# Patient Record
Sex: Male | Born: 1970 | Race: White | Hispanic: No | Marital: Single | Smoking: Never smoker
Health system: Southern US, Community
[De-identification: ages and names within clinical notes are randomized; demographics above are authoritative.]

## PROBLEM LIST (undated history)

## (undated) DIAGNOSIS — M1990 Unspecified osteoarthritis, unspecified site: Secondary | ICD-10-CM

## (undated) DIAGNOSIS — S46211A Strain of muscle, fascia and tendon of other parts of biceps, right arm, initial encounter: Secondary | ICD-10-CM

## (undated) DIAGNOSIS — Z5189 Encounter for other specified aftercare: Secondary | ICD-10-CM

## (undated) DIAGNOSIS — K219 Gastro-esophageal reflux disease without esophagitis: Secondary | ICD-10-CM

## (undated) HISTORY — PX: LACERATION REPAIR: SHX5168

## (undated) HISTORY — PX: WISDOM TOOTH EXTRACTION: SHX21

## (undated) HISTORY — DX: Unspecified osteoarthritis, unspecified site: M19.90

## (undated) HISTORY — DX: Encounter for other specified aftercare: Z51.89

## (undated) HISTORY — PX: KNEE SURGERY: SHX244

---

## 2001-02-05 ENCOUNTER — Emergency Department (HOSPITAL_COMMUNITY): Admission: EM | Admit: 2001-02-05 | Discharge: 2001-02-05 | Payer: Self-pay

## 2001-03-03 ENCOUNTER — Encounter: Admission: RE | Admit: 2001-03-03 | Discharge: 2001-03-03 | Payer: Self-pay | Admitting: Orthopedic Surgery

## 2001-03-03 ENCOUNTER — Encounter: Payer: Self-pay | Admitting: Orthopedic Surgery

## 2001-03-06 ENCOUNTER — Encounter: Admission: RE | Admit: 2001-03-06 | Discharge: 2001-03-06 | Payer: Self-pay | Admitting: Orthopedic Surgery

## 2001-03-06 ENCOUNTER — Encounter: Payer: Self-pay | Admitting: Orthopedic Surgery

## 2002-03-13 ENCOUNTER — Encounter: Payer: Self-pay | Admitting: Urology

## 2002-03-13 ENCOUNTER — Encounter: Admission: RE | Admit: 2002-03-13 | Discharge: 2002-03-13 | Payer: Self-pay | Admitting: Urology

## 2002-03-14 ENCOUNTER — Encounter: Payer: Self-pay | Admitting: Urology

## 2002-03-14 ENCOUNTER — Encounter: Admission: RE | Admit: 2002-03-14 | Discharge: 2002-03-14 | Payer: Self-pay | Admitting: Urology

## 2006-11-09 ENCOUNTER — Encounter: Admission: RE | Admit: 2006-11-09 | Discharge: 2006-11-09 | Payer: Self-pay | Admitting: Orthopedic Surgery

## 2016-11-04 ENCOUNTER — Emergency Department (HOSPITAL_COMMUNITY): Payer: 59

## 2016-11-04 ENCOUNTER — Emergency Department (HOSPITAL_COMMUNITY)
Admission: EM | Admit: 2016-11-04 | Discharge: 2016-11-04 | Disposition: A | Discharge status: Home / Self Care | Payer: 59 | Attending: Emergency Medicine | Admitting: Emergency Medicine

## 2016-11-04 ENCOUNTER — Encounter (HOSPITAL_COMMUNITY): Payer: Self-pay

## 2016-11-04 DIAGNOSIS — J189 Pneumonia, unspecified organism: Secondary | ICD-10-CM | POA: Diagnosis not present

## 2016-11-04 DIAGNOSIS — Z7982 Long term (current) use of aspirin: Secondary | ICD-10-CM | POA: Diagnosis not present

## 2016-11-04 DIAGNOSIS — R0789 Other chest pain: Secondary | ICD-10-CM | POA: Diagnosis present

## 2016-11-04 LAB — CBC
HCT: 43.1 % (ref 39.0–52.0)
HEMOGLOBIN: 15.1 g/dL (ref 13.0–17.0)
MCH: 30.7 pg (ref 26.0–34.0)
MCHC: 35 g/dL (ref 30.0–36.0)
MCV: 87.6 fL (ref 78.0–100.0)
Platelets: 239 10*3/uL (ref 150–400)
RBC: 4.92 MIL/uL (ref 4.22–5.81)
RDW: 12.4 % (ref 11.5–15.5)
WBC: 9 10*3/uL (ref 4.0–10.5)

## 2016-11-04 LAB — BASIC METABOLIC PANEL
ANION GAP: 8 (ref 5–15)
BUN: 12 mg/dL (ref 6–20)
CALCIUM: 9.3 mg/dL (ref 8.9–10.3)
CHLORIDE: 105 mmol/L (ref 101–111)
CO2: 25 mmol/L (ref 22–32)
Creatinine, Ser: 1.16 mg/dL (ref 0.61–1.24)
GFR calc non Af Amer: >60 mL/min (ref >60)
Glucose, Bld: 92 mg/dL (ref 65–99)
POTASSIUM: 3.6 mmol/L (ref 3.5–5.1)
Sodium: 138 mmol/L (ref 135–145)

## 2016-11-04 LAB — I-STAT TROPONIN, ED
TROPONIN I, POC: 0 ng/mL (ref 0.00–0.08)
Troponin i, poc: 0 ng/mL (ref 0.00–0.08)

## 2016-11-04 MED ORDER — PREDNISONE 20 MG PO TABS
60.0000 mg | ORAL_TABLET | Freq: Once | ORAL | Status: AC
  Administered 2016-11-04: 60 mg via ORAL
  Filled 2016-11-04: qty 3

## 2016-11-04 MED ORDER — CEFTRIAXONE SODIUM 1 G IJ SOLR
1.0000 g | Freq: Once | INTRAMUSCULAR | Status: AC
  Administered 2016-11-04: 1 g via INTRAMUSCULAR
  Filled 2016-11-04: qty 10

## 2016-11-04 MED ORDER — PREDNISONE 50 MG PO TABS: ORAL_TABLET | ORAL | 0 refills | Status: DC

## 2016-11-04 MED ORDER — LIDOCAINE HCL (PF) 1 % IJ SOLN
INTRAMUSCULAR | Status: AC
  Administered 2016-11-04: 22:00:00
  Filled 2016-11-04: qty 5

## 2016-11-04 MED ORDER — AZITHROMYCIN 250 MG PO TABS: 250.0000 mg | ORAL_TABLET | Freq: Every day | ORAL | 0 refills | Status: DC

## 2016-11-04 MED ORDER — ALBUTEROL SULFATE HFA 108 (90 BASE) MCG/ACT IN AERS
2.0000 | INHALATION_SPRAY | RESPIRATORY_TRACT | Status: DC | PRN
  Administered 2016-11-04: 2 via RESPIRATORY_TRACT
  Filled 2016-11-04: qty 6.7

## 2016-11-04 MED ORDER — IPRATROPIUM-ALBUTEROL 0.5-2.5 (3) MG/3ML IN SOLN
3.0000 mL | Freq: Once | RESPIRATORY_TRACT | Status: AC
  Administered 2016-11-04: 3 mL via RESPIRATORY_TRACT
  Filled 2016-11-04: qty 3

## 2016-11-04 NOTE — Discharge Instructions (Signed)
°  Take your antibiotics as directed and to completion. You should never have any leftover antibiotics! Push fluids and stay well hydrated.  ° °Please follow with your primary care doctor in the next 2 days for a check-up. They must obtain records for further management.  ° °Do not hesitate to return to the Emergency Department for any new, worsening or concerning symptoms.  ° °

## 2016-11-04 NOTE — ED Provider Notes (Signed)
[facility] DEPT Provider Note   CSN: [id number] Arrival date & time: 11/04/16  1818     History   Chief Complaint Chief Complaint  Patient presents with  . Chest Pain   HPI   Blood pressure 126/84, pulse 61, temperature 97.7 F (36.5 C), temperature source Oral, resp. rate 19, SpO2 98 %.  Joseph Allen is a 45 y.o. male complaining of Anterior diffuse chest tightness intermittently over the course of the day lasting approximately 1.5 minutes that associated with shortness of breath, syncope, lightheadedness, nausea, vomiting, diaphoresis. He had a similar episode several years ago when he was stressed. He states that he has a lot of stress at his job right now. He denies fever, chills, diabetes, hypertension, hyperlipidemia, family history of ACS, tobacco use, cocaine or methamphetamine use. He has had productive cough recently, states it was exacerbated when he did a training exercise outside.   History reviewed. No pertinent past medical history.  There are no active problems to display for this patient.   History reviewed. No pertinent surgical history.     Home Medications    Prior to Admission medications   Medication Sig Start Date End Date Taking? Authorizing Provider  aspirin 325 MG tablet Take 650 mg by mouth every 6 (six) hours as needed for moderate pain.    Yes Historical Provider, MD  omeprazole (PRILOSEC OTC) 20 MG tablet Take 20 mg by mouth daily.   Yes Historical Provider, MD  VIAGRA 100 MG tablet Take 100 mg by mouth daily as needed for erectile dysfunction.  09/28/16  Yes Historical Provider, MD  azithromycin (ZITHROMAX Z-PAK) 250 MG tablet Take 1 tablet (250 mg total) by mouth daily. 500mg  PO day 1, then 250mg  PO days 205 11/04/16   Shalisha Clausing, PA-C  predniSONE (DELTASONE) 50 MG tablet Take 1 tablet daily with breakfast 11/04/16   Monico Blitz, PA-C    Family History History reviewed. No pertinent family history.  Social  History Social History  Substance Use Topics  . Smoking status: Never Smoker  . Smokeless tobacco: Never Used  . Alcohol use Yes     Comment: occ     Allergies   Patient has no known allergies.   Review of Systems Review of Systems  10 systems reviewed and found to be negative, except as noted in the HPI.  Physical Exam Updated Vital Signs BP 112/70   Pulse 62   Temp 97.7 F (36.5 C) (Oral)   Resp 18   SpO2 99%   Physical Exam  Constitutional: He is oriented to person, place, and time. He appears well-developed and well-nourished. No distress.  HENT:  Head: Normocephalic and atraumatic.  Mouth/Throat: Oropharynx is clear and moist.  Eyes: Conjunctivae and EOM are normal. Pupils are equal, round, and reactive to light.  Neck: Normal range of motion. No JVD present. No tracheal deviation present.  Cardiovascular: Normal rate, regular rhythm and intact distal pulses.   Radial pulse equal bilaterally  Pulmonary/Chest: Effort normal and breath sounds normal. No stridor. No respiratory distress. He has no wheezes. He has no rales. He exhibits no tenderness.  Abdominal: Soft. He exhibits no distension and no mass. There is no tenderness. There is no rebound and no guarding.  Musculoskeletal: Normal range of motion. He exhibits no edema or tenderness.  No calf asymmetry, superficial collaterals, palpable cords, edema, Homans sign negative bilaterally.    Neurological: He is alert and oriented to person, place, and time.  Skin: Skin is warm.  He is not diaphoretic.  Psychiatric: He has a normal mood and affect.  Nursing note and vitals reviewed.    ED Treatments / Results  Labs (all labs ordered are listed, but only abnormal results are displayed) Labs Reviewed  BASIC METABOLIC PANEL  CBC  I-STAT [place], ED  I-STAT [place], ED    EKG  EKG Interpretation  Date/Time:  Thursday November 04 2016 18:26:40 EST Ventricular Rate:  69 PR Interval:  144 QRS  Duration: 84 QT Interval:  372 QTC Calculation: 398 R Axis:   58 Text Interpretation:  Normal sinus rhythm Normal ECG Confirmed by DELO  MD, DOUGLAS ([id number]) on 11/04/2016 8:31:18 PM       Radiology Dg Chest 2 View  Result Date: 11/04/2016 CLINICAL DATA:  Intermittent chest tightness today. EXAM: CHEST  2 VIEW COMPARISON:  None. FINDINGS: Right lung is clear. There is some minimal atelectasis or pneumonia at the left base. The cardiopericardial silhouette is within normal limits for size. The visualized bony structures of the thorax are intact. IMPRESSION: Subtle patchy airspace disease left lung base compatible with atelectasis or pneumonia. Electronically Signed   By: Misty Stanley M.D.   On: 11/04/2016 18:50    Procedures Procedures (including critical care time)  Medications Ordered in ED Medications  albuterol (PROVENTIL HFA;VENTOLIN HFA) 108 (90 Base) MCG/ACT inhaler 2 puff (2 puffs Inhalation Given 11/04/16 2236)  ipratropium-albuterol (DUONEB) 0.5-2.5 (3) MG/3ML nebulizer solution 3 mL (3 mLs Nebulization Given 11/04/16 2040)  predniSONE (DELTASONE) tablet 60 mg (60 mg Oral Given 11/04/16 2040)  cefTRIAXone (ROCEPHIN) injection 1 g (1 g Intramuscular Given 11/04/16 2206)  lidocaine (PF) (XYLOCAINE) 1 % injection (  Given 11/04/16 2206)     Initial Impression / Assessment and Plan / ED Course  I have reviewed the triage vital signs and the nursing notes.  Pertinent labs & imaging results that were available during my care of the patient were reviewed by me and considered in my medical decision making (see chart for details).  Clinical Course     Vitals:   11/04/16 2045 11/04/16 2115 11/04/16 2145 11/04/16 2230  BP: 124/84 123/78 115/74 112/70  Pulse: (!) 52 (!) 58 (!) 59 62  Resp: 16 15 16 18   Temp:      TempSrc:      SpO2: 100% 98% 98% 99%    Medications  albuterol (PROVENTIL HFA;VENTOLIN HFA) 108 (90 Base) MCG/ACT inhaler 2 puff (2 puffs Inhalation Given  11/04/16 2236)  ipratropium-albuterol (DUONEB) 0.5-2.5 (3) MG/3ML nebulizer solution 3 mL (3 mLs Nebulization Given 11/04/16 2040)  predniSONE (DELTASONE) tablet 60 mg (60 mg Oral Given 11/04/16 2040)  cefTRIAXone (ROCEPHIN) injection 1 g (1 g Intramuscular Given 11/04/16 2206)  lidocaine (PF) (XYLOCAINE) 1 % injection (  Given 11/04/16 2206)    Joseph Allen is 45 y.o. male presenting with Diffuse anterior chest tightness intermittently over the course of the last day, episodes last approximately 1-2 minutes and resolve spontaneously. Not exertional. No other associated symptoms aside from productive cough. Lung sounds clear. Chest x-ray with atelectasis versus pneumonia, will start on community-acquired regimen although I am not convinced that this is a true pneumonia. EKG unchanged. Blood work reassuring. He is low risk by heart score. Will obtain delta troponin, think there may be an element of anxiety and possibly bronchitis with reactive airway causing this tightness, patient will be given prednisone and nebulizer.   She feels better after nebulizer, delta troponin negative.  Evaluation does not show pathology  that would require ongoing emergent intervention or inpatient treatment. Pt is hemodynamically stable and mentating appropriately. Discussed findings and plan with patient/guardian, who agrees with care plan. All questions answered. Return precautions discussed and outpatient follow up given.    Final Clinical Impressions(s) / ED Diagnoses   Final diagnoses:  Community acquired pneumonia, unspecified laterality    New Prescriptions Discharge Medication List as of 11/04/2016 10:22 PM    START taking these medications   Details  azithromycin (ZITHROMAX Z-PAK) 250 MG tablet Take 1 tablet (250 mg total) by mouth daily. 500mg  PO day 1, then 250mg  PO days 205, Starting Thu 11/04/2016, Print    predniSONE (DELTASONE) 50 MG tablet Take 1 tablet daily with breakfast, Print          Monico Blitz, PA-C 11/04/16 [place], MD 11/04/16 [phone]

## 2016-11-04 NOTE — ED Triage Notes (Signed)
Pt reports feeling chest tightness several times intermittently today. No cardiac hx. Pt denies nausea or diaphoresis. Pt describes the pain as an overworked muscle.

## 2016-11-04 NOTE — ED Notes (Signed)
Patient Alert and oriented X4. Stable and ambulatory. Patient verbalized understanding of the discharge instructions.  Patient belongings were taken by the patient.  

## 2016-11-16 DIAGNOSIS — J189 Pneumonia, unspecified organism: Secondary | ICD-10-CM | POA: Diagnosis not present

## 2017-04-19 DIAGNOSIS — Z0000 Encounter for general adult medical examination without abnormal findings: Secondary | ICD-10-CM | POA: Diagnosis not present

## 2017-04-19 DIAGNOSIS — Z125 Encounter for screening for malignant neoplasm of prostate: Secondary | ICD-10-CM | POA: Diagnosis not present

## 2017-04-19 DIAGNOSIS — R7301 Impaired fasting glucose: Secondary | ICD-10-CM | POA: Diagnosis not present

## 2017-04-26 DIAGNOSIS — M5136 Other intervertebral disc degeneration, lumbar region: Secondary | ICD-10-CM | POA: Diagnosis not present

## 2017-04-26 DIAGNOSIS — Z23 Encounter for immunization: Secondary | ICD-10-CM | POA: Diagnosis not present

## 2017-04-26 DIAGNOSIS — Z0000 Encounter for general adult medical examination without abnormal findings: Secondary | ICD-10-CM | POA: Diagnosis not present

## 2017-04-26 DIAGNOSIS — Z1389 Encounter for screening for other disorder: Secondary | ICD-10-CM | POA: Diagnosis not present

## 2017-04-26 DIAGNOSIS — R7301 Impaired fasting glucose: Secondary | ICD-10-CM | POA: Diagnosis not present

## 2017-07-17 ENCOUNTER — Emergency Department (HOSPITAL_BASED_OUTPATIENT_CLINIC_OR_DEPARTMENT_OTHER)
Admission: EM | Admit: 2017-07-17 | Discharge: 2017-07-17 | Disposition: A | Discharge status: Home / Self Care | Payer: Worker's Compensation | Attending: Emergency Medicine | Admitting: Emergency Medicine

## 2017-07-17 ENCOUNTER — Emergency Department (HOSPITAL_BASED_OUTPATIENT_CLINIC_OR_DEPARTMENT_OTHER): Payer: Worker's Compensation

## 2017-07-17 ENCOUNTER — Encounter (HOSPITAL_BASED_OUTPATIENT_CLINIC_OR_DEPARTMENT_OTHER): Payer: Self-pay | Admitting: Emergency Medicine

## 2017-07-17 DIAGNOSIS — Z79899 Other long term (current) drug therapy: Secondary | ICD-10-CM | POA: Diagnosis not present

## 2017-07-17 DIAGNOSIS — M79601 Pain in right arm: Secondary | ICD-10-CM | POA: Diagnosis not present

## 2017-07-17 NOTE — ED Triage Notes (Signed)
Pt had a heavy box fall at work on Wednesday and when he tried to catch it he hyperextended the R arm. Pt c/o R arm pain radiating into his axilla area. Pt taking OTC meds, using ice and heat without relief.

## 2017-07-17 NOTE — Discharge Instructions (Signed)
Your x-rays are normal. You likely have a muscle or tendon strain Take ibuprofen, tylenol and ice Follow-up with sports medicine We recommend light duty for next 2 weeks

## 2017-07-17 NOTE — ED Provider Notes (Signed)
[facility] Provider Note   CSN: [id number] Arrival date & time: 07/17/17  1151     History   Chief Complaint Chief Complaint  Patient presents with  . Arm pain    HPI Joseph MCMANAMAN is a 46 y.o. male.  The history is provided by the patient.   46 year old male who presents with right arm pain. He is right arm dominant and otherwise healthy. Works in the [employer] office and was doing heavy lifting on Wednesday. States that the box that he was lifting was fighting out and it hyperextended his arm. He heard a pop and subsequently had pain in the elbow and forearm. Has been using ice, ibuprofen for supportive care without improvement in his symptoms. Has not had any arm swelling. States that he has not been able to lift with his arm, but is able to have normal range of motion. No numbness or weakness. Pain worse when he tries to lift. No alleviating factor.  History reviewed. No pertinent past medical history.  There are no active problems to display for this patient.   History reviewed. No pertinent surgical history.     Home Medications    Prior to Admission medications   Medication Sig Start Date End Date Taking? Authorizing Provider  aspirin 325 MG tablet Take 650 mg by mouth every 6 (six) hours as needed for moderate pain.     [provider]  azithromycin (ZITHROMAX Z-PAK) 250 MG tablet Take 1 tablet (250 mg total) by mouth daily. 500mg  PO day 1, then 250mg  PO days 205 11/04/16   Pisciotta, Elmyra Ricks, PA-C  omeprazole (PRILOSEC OTC) 20 MG tablet Take 20 mg by mouth daily.    [provider]  predniSONE (DELTASONE) 50 MG tablet Take 1 tablet daily with breakfast 11/04/16   Pisciotta, Elmyra Ricks, PA-C  VIAGRA 100 MG tablet Take 100 mg by mouth daily as needed for erectile dysfunction.  09/28/16   [provider]    Family History No family history on file.  Social History Social History  Substance Use Topics  . Smoking status: Never  Smoker  . Smokeless tobacco: Never Used  . Alcohol use Yes     Comment: occ     Allergies   Patient has no known allergies.   Review of Systems Review of Systems  Constitutional: Negative for fever.  Cardiovascular: Negative for chest pain.  Musculoskeletal: Positive for arthralgias (right arm pain).  Neurological: Negative for weakness and numbness.  Hematological: Does not bruise/bleed easily.     Physical Exam Updated Vital Signs BP 130/81 (BP Location: Right Arm)   Pulse 66   Temp 98.7 F (37.1 C) (Oral)   Resp 16   Ht 5\' 9"  (1.753 m)   Wt 87.1 kg (192 lb)   SpO2 97%   BMI 28.35 kg/m   Physical Exam Physical Exam  Constitutional: Appears well-developed and well-nourished. No acute distress. HENT:  Head: Normocephalic.  Eyes: Conjunctivae are normal.  Cardiovascular: Normal rate and intact distal pulses.  +2 radial pulses Pulmonary/Chest: Effort normal. No respiratory distress.  Abdominal: Exhibits no distension.  Musculoskeletal: Normal range of motion. Exhibits no deformity. Tenderness to deep palpation of the right mid forearm. Normal range of motion of the right arm.  Neurological: Alert. Fluent speech. Intact innervation of the ulnar, radial, median, and axillary nerves of the right arm and hand. Skin: Skin is warm and dry.  Psychiatric: Normal mood and affect. Behavior is normal.  Nursing note and vitals reviewed.  ED Treatments / Results  Labs (all labs ordered are listed, but only abnormal results are displayed) Labs Reviewed - No data to display  EKG  EKG Interpretation None       Radiology Dg Elbow Complete Right  Result Date: 07/17/2017 CLINICAL DATA:  Left elbow pain after an injury trying to catch a falling box. EXAM: RIGHT ELBOW - COMPLETE 3+ VIEW COMPARISON:  None. FINDINGS: There is no evidence of fracture, dislocation, or joint effusion. There is no evidence of arthropathy or other focal bone abnormality. Soft tissues are  unremarkable. IMPRESSION: Normal examination. Electronically Signed   By: Claudie Revering M.D.   On: 07/17/2017 13:28   Dg Forearm Right  Result Date: 07/17/2017 CLINICAL DATA:  Right arm pain following an injury trying to catch a falling box. EXAM: RIGHT FOREARM - 2 VIEW COMPARISON:  Left elbow radiographs obtained today. FINDINGS: There is no evidence of fracture or other focal bone lesions. Soft tissues are unremarkable. IMPRESSION: Normal examination. Electronically Signed   By: Claudie Revering M.D.   On: 07/17/2017 13:28    Procedures Procedures (including critical care time)  Medications Ordered in ED Medications - No data to display   Initial Impression / Assessment and Plan / ED Course  I have reviewed the triage vital signs and the nursing notes.  Pertinent labs & imaging results that were available during my care of the patient were reviewed by me and considered in my medical decision making (see chart for details).     46 year old male who presents with right arm pain after heavy lifting. Range of motion intact. Neurovascularly intact. X-rays are normal. Suspect muscle or tendon strain. Supportive care reviewed. Follow up with sports medicine. Strict return and follow-up instructions reviewed. He expressed understanding of all discharge instructions and felt comfortable with the plan of care.   Final Clinical Impressions(s) / ED Diagnoses   Final diagnoses:  Right arm pain    New Prescriptions New Prescriptions   No medications on file     Forde Dandy, MD 07/17/17 1349

## 2017-08-01 ENCOUNTER — Other Ambulatory Visit: Payer: Self-pay | Admitting: Orthopedic Surgery

## 2017-08-01 ENCOUNTER — Encounter (HOSPITAL_COMMUNITY): Payer: Self-pay | Admitting: *Deleted
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
## 2017-08-02 ENCOUNTER — Ambulatory Visit (HOSPITAL_COMMUNITY)
Admission: RE | Admit: 2017-08-02 | Discharge: 2017-08-02 | Disposition: A | Discharge status: Home / Self Care | Payer: Worker's Compensation | Source: Ambulatory Visit | Attending: Orthopedic Surgery | Admitting: Orthopedic Surgery

## 2017-08-02 ENCOUNTER — Ambulatory Visit (HOSPITAL_BASED_OUTPATIENT_CLINIC_OR_DEPARTMENT_OTHER): Payer: Worker's Compensation | Admitting: Certified Registered"

## 2017-08-02 ENCOUNTER — Encounter (HOSPITAL_COMMUNITY): Payer: Self-pay | Admitting: *Deleted

## 2017-08-02 ENCOUNTER — Encounter (HOSPITAL_COMMUNITY)
Admission: RE | Disposition: A | Discharge status: Home / Self Care | Payer: Self-pay | Source: Ambulatory Visit | Attending: Orthopedic Surgery

## 2017-08-02 DIAGNOSIS — X500XXA Overexertion from strenuous movement or load, initial encounter: Secondary | ICD-10-CM | POA: Insufficient documentation

## 2017-08-02 DIAGNOSIS — Y990 Civilian activity done for income or pay: Secondary | ICD-10-CM | POA: Diagnosis not present

## 2017-08-02 DIAGNOSIS — S46211A Strain of muscle, fascia and tendon of other parts of biceps, right arm, initial encounter: Secondary | ICD-10-CM | POA: Insufficient documentation

## 2017-08-02 HISTORY — PX: DISTAL BICEPS TENDON REPAIR: SHX1461

## 2017-08-02 HISTORY — DX: Strain of muscle, fascia and tendon of other parts of biceps, right arm, initial encounter: S46.211A

## 2017-08-02 HISTORY — DX: Gastro-esophageal reflux disease without esophagitis: K21.9

## 2017-08-02 SURGERY — REPAIR, TENDON, BICEPS, DISTAL
Anesthesia: General | Site: Elbow | Laterality: Right

## 2017-08-02 MED ORDER — MIDAZOLAM HCL 2 MG/2ML IJ SOLN
1.0000 mg | INTRAMUSCULAR | Status: DC | PRN
  Administered 2017-08-02: 2 mg via INTRAVENOUS

## 2017-08-02 MED ORDER — SCOPOLAMINE 1 MG/3DAYS TD PT72: 1.0000 | MEDICATED_PATCH | Freq: Once | TRANSDERMAL | Status: DC | PRN

## 2017-08-02 MED ORDER — BUPIVACAINE-EPINEPHRINE (PF) 0.5% -1:200000 IJ SOLN
INTRAMUSCULAR | Status: DC | PRN
  Administered 2017-08-02: 30 mL via PERINEURAL

## 2017-08-02 MED ORDER — OXYCODONE HCL 5 MG/5ML PO SOLN: 5.0000 mg | Freq: Once | ORAL | Status: DC | PRN

## 2017-08-02 MED ORDER — PROMETHAZINE HCL 25 MG/ML IJ SOLN: 6.2500 mg | INTRAMUSCULAR | Status: DC | PRN

## 2017-08-02 MED ORDER — FENTANYL CITRATE (PF) 100 MCG/2ML IJ SOLN
50.0000 ug | INTRAMUSCULAR | Status: DC | PRN
  Administered 2017-08-02: 100 ug via INTRAVENOUS

## 2017-08-02 MED ORDER — OXYCODONE HCL 5 MG PO TABS: 5.0000 mg | ORAL_TABLET | Freq: Once | ORAL | Status: DC | PRN

## 2017-08-02 MED ORDER — CEFAZOLIN SODIUM-DEXTROSE 2-4 GM/100ML-% IV SOLN
2.0000 g | INTRAVENOUS | Status: AC
  Administered 2017-08-02: 2 g via INTRAVENOUS

## 2017-08-02 MED ORDER — DEXAMETHASONE SODIUM PHOSPHATE 10 MG/ML IJ SOLN
INTRAMUSCULAR | Status: DC | PRN
  Administered 2017-08-02: 10 mg via INTRAVENOUS

## 2017-08-02 MED ORDER — MIDAZOLAM HCL 2 MG/2ML IJ SOLN
INTRAMUSCULAR | Status: AC
  Filled 2017-08-02: qty 2

## 2017-08-02 MED ORDER — PROPOFOL 10 MG/ML IV BOLUS
INTRAVENOUS | Status: DC | PRN
  Administered 2017-08-02: 200 mg via INTRAVENOUS

## 2017-08-02 MED ORDER — LACTATED RINGERS IV SOLN
INTRAVENOUS | Status: DC
  Administered 2017-08-02 (×2): via INTRAVENOUS

## 2017-08-02 MED ORDER — LACTATED RINGERS IV SOLN: INTRAVENOUS | Status: DC

## 2017-08-02 MED ORDER — MEPERIDINE HCL 25 MG/ML IJ SOLN: 6.2500 mg | INTRAMUSCULAR | Status: DC | PRN

## 2017-08-02 MED ORDER — ONDANSETRON HCL 4 MG/2ML IJ SOLN
INTRAMUSCULAR | Status: DC | PRN
  Administered 2017-08-02: 4 mg via INTRAVENOUS

## 2017-08-02 MED ORDER — HYDROMORPHONE HCL 1 MG/ML IJ SOLN: 0.2500 mg | INTRAMUSCULAR | Status: DC | PRN

## 2017-08-02 MED ORDER — FENTANYL CITRATE (PF) 100 MCG/2ML IJ SOLN
INTRAMUSCULAR | Status: AC
  Filled 2017-08-02: qty 2

## 2017-08-02 MED ORDER — CEFAZOLIN SODIUM-DEXTROSE 2-4 GM/100ML-% IV SOLN
INTRAVENOUS | Status: AC
  Filled 2017-08-02: qty 100

## 2017-08-02 MED ORDER — LIDOCAINE HCL (CARDIAC) 20 MG/ML IV SOLN
INTRAVENOUS | Status: DC | PRN
  Administered 2017-08-02: 30 mg via INTRAVENOUS

## 2017-08-02 SURGICAL SUPPLY — 67 items
APPLIER CLIP 9.375 MED OPEN (MISCELLANEOUS)
BENZOIN TINCTURE PRP APPL 2/3 (GAUZE/BANDAGES/DRESSINGS) ×3 IMPLANT
BLADE SURG 15 STRL LF DISP TIS (BLADE) ×2 IMPLANT
BLADE SURG 15 STRL SS (BLADE) ×4
BNDG COHESIVE 4X5 TAN STRL (GAUZE/BANDAGES/DRESSINGS) ×3 IMPLANT
BNDG ESMARK 4X9 LF (GAUZE/BANDAGES/DRESSINGS) ×3 IMPLANT
BNDG GAUZE ELAST 4 BULKY (GAUZE/BANDAGES/DRESSINGS) ×3 IMPLANT
BRUSH SCRUB EZ PLAIN DRY (MISCELLANEOUS) IMPLANT
CHLORAPREP W/TINT 26ML (MISCELLANEOUS) ×3 IMPLANT
CLIP APPLIE 9.375 MED OPEN (MISCELLANEOUS) IMPLANT
CLIP VESOCCLUDE MED 6/CT (CLIP) ×3 IMPLANT
CLOSURE WOUND 1/2 X4 (GAUZE/BANDAGES/DRESSINGS) ×1
CORD BIPOLAR FORCEPS 12FT (ELECTRODE) ×3 IMPLANT
COVER BACK TABLE 60X90IN (DRAPES) ×3 IMPLANT
COVER MAYO STAND STRL (DRAPES) ×3 IMPLANT
CUFF TOURNIQUET SINGLE 18IN (TOURNIQUET CUFF) ×3 IMPLANT
CUFF TOURNIQUET SINGLE 24IN (TOURNIQUET CUFF) IMPLANT
DRAPE C-ARM 42X72 X-RAY (DRAPES) IMPLANT
DRAPE EXTREMITY T 121X128X90 (DRAPE) ×3 IMPLANT
DRAPE OEC MINIVIEW 54X84 (DRAPES) ×3 IMPLANT
DRAPE SURG 17X23 STRL (DRAPES) ×3 IMPLANT
DRSG ADAPTIC 3X8 NADH LF (GAUZE/BANDAGES/DRESSINGS) IMPLANT
DRSG EMULSION OIL 3X3 NADH (GAUZE/BANDAGES/DRESSINGS) ×3 IMPLANT
ELECT REM PT RETURN 9FT ADLT (ELECTROSURGICAL) ×3
ELECTRODE REM PT RTRN 9FT ADLT (ELECTROSURGICAL) ×1 IMPLANT
GAUZE SPONGE 4X4 12PLY STRL LF (GAUZE/BANDAGES/DRESSINGS) ×6 IMPLANT
GLOVE BIO SURGEON STRL SZ7.5 (GLOVE) ×3 IMPLANT
GLOVE BIOGEL PI IND STRL 7.0 (GLOVE) ×2 IMPLANT
GLOVE BIOGEL PI IND STRL 8 (GLOVE) ×1 IMPLANT
GLOVE BIOGEL PI INDICATOR 7.0 (GLOVE) ×4
GLOVE BIOGEL PI INDICATOR 8 (GLOVE) ×2
GLOVE ECLIPSE 6.5 STRL STRAW (GLOVE) ×6 IMPLANT
GOWN STRL REUS W/TWL XL LVL3 (GOWN DISPOSABLE) ×3 IMPLANT
KIT TRANSTIBIAL (DISPOSABLE) ×3 IMPLANT
NDL SUT 6 .5 CRC .975X.05 MAYO (NEEDLE) ×1 IMPLANT
NEEDLE HYPO 25X1 1.5 SAFETY (NEEDLE) IMPLANT
NEEDLE MAYO TAPER (NEEDLE) ×2
NS IRRIG 1000ML POUR BTL (IV SOLUTION) ×3 IMPLANT
PACK BASIN DAY SURGERY FS (CUSTOM PROCEDURE TRAY) ×3 IMPLANT
PADDING CAST ABS 4INX4YD NS (CAST SUPPLIES) ×2
PADDING CAST ABS COTTON 4X4 ST (CAST SUPPLIES) ×1 IMPLANT
PENCIL BUTTON HOLSTER BLD 10FT (ELECTRODE) ×3 IMPLANT
SLEEVE SCD COMPRESS KNEE MED (MISCELLANEOUS) ×3 IMPLANT
SPLINT FIBERGLASS 4X30 (CAST SUPPLIES) ×3 IMPLANT
SPONGE LAP 4X18 X RAY DECT (DISPOSABLE) ×3 IMPLANT
STOCKINETTE 6  STRL (DRAPES) ×2
STOCKINETTE 6 STRL (DRAPES) ×1 IMPLANT
STRIP CLOSURE SKIN 1/2X4 (GAUZE/BANDAGES/DRESSINGS) ×2 IMPLANT
SUCTION FRAZIER HANDLE 10FR (MISCELLANEOUS) ×2
SUCTION TUBE FRAZIER 10FR DISP (MISCELLANEOUS) ×1 IMPLANT
SUT FIBERWIRE #2 38 T-5 BLUE (SUTURE)
SUT FIBERWIRE 2-0 18 17.9 3/8 (SUTURE)
SUT VIC AB 0 SH 27 (SUTURE) IMPLANT
SUT VIC AB 2-0 CT3 27 (SUTURE) IMPLANT
SUT VIC AB 3-0 SH 27 (SUTURE) ×2
SUT VIC AB 3-0 SH 27X BRD (SUTURE) ×1 IMPLANT
SUT VICRYL 4-0 PS2 18IN ABS (SUTURE) IMPLANT
SUT VICRYL RAPIDE 4/0 PS 2 (SUTURE) ×3 IMPLANT
SUTURE FIBERWR #2 38 T-5 BLUE (SUTURE) IMPLANT
SUTURE FIBERWR 2-0 18 17.9 3/8 (SUTURE) IMPLANT
SYR 10ML LL (SYRINGE) IMPLANT
SYR BULB 3OZ (MISCELLANEOUS) ×3 IMPLANT
SYSTEM ARTHRO FOR DISTAL BICEP (Orthopedic Implant) ×3 IMPLANT
TOWEL OR 17X24 6PK STRL BLUE (TOWEL DISPOSABLE) ×3 IMPLANT
TUBE CONNECTING 20'X1/4 (TUBING) ×1
TUBE CONNECTING 20X1/4 (TUBING) ×2 IMPLANT
UNDERPAD 30X30 (UNDERPADS AND DIAPERS) ×3 IMPLANT

## 2017-08-02 NOTE — Progress Notes (Signed)
Assisted Dr. Germeroth with right, ultrasound guided, supraclavicular block. Side rails up, monitors on throughout procedure. See vital signs in flow sheet. Tolerated Procedure well. 

## 2017-08-02 NOTE — Anesthesia Procedure Notes (Addendum)
Anesthesia Regional Block: Interscalene brachial plexus block   Pre-Anesthetic Checklist: ,, timeout performed, Correct Patient, Correct Site, Correct Laterality, Correct Procedure, Correct Position, site marked, Risks and benefits discussed,  Surgical consent,  Pre-op evaluation,  At surgeon's request and post-op pain management  Laterality: Right  Prep: chloraprep       Needles:  Injection technique: Single-shot  Needle Type: Stimiplex          Additional Needles:   Procedures:,,,, ultrasound used (permanent image in chart),,,,  Narrative:  Start time: 08/02/2017 12:48 PM End time: 08/02/2017 12:52 PM  Performed by: Personally  Anesthesiologist: Nolon Nations  Additional Notes: Patient tolerated the procedure well without complications

## 2017-08-02 NOTE — Anesthesia Preprocedure Evaluation (Addendum)
Anesthesia Evaluation  Patient identified by MRN, date of birth, ID band Patient awake    Reviewed: Allergy & Precautions, NPO status , Patient's Chart, lab work & pertinent test results  Airway Mallampati: II  TM Distance: >3 FB Neck ROM: Full    Dental no notable dental hx.    Pulmonary neg pulmonary ROS,    Pulmonary exam normal breath sounds clear to auscultation       Cardiovascular negative cardio ROS Normal cardiovascular exam Rhythm:Regular Rate:Normal     Neuro/Psych negative neurological ROS  negative psych ROS   GI/Hepatic Neg liver ROS, GERD  ,  Endo/Other  negative endocrine ROS  Renal/GU negative Renal ROS     Musculoskeletal negative musculoskeletal ROS (+)   Abdominal   Peds  Hematology negative hematology ROS (+)   Anesthesia Other Findings   Reproductive/Obstetrics negative OB ROS                            Anesthesia Physical Anesthesia Plan  ASA: II  Anesthesia Plan: Regional and General   Post-op Pain Management:    Induction: Intravenous  PONV Risk Score and Plan: 2 and Ondansetron and Dexamethasone  Airway Management Planned: LMA  Additional Equipment:   Intra-op Plan:   Post-operative Plan: Extubation in OR  Informed Consent: I have reviewed the patients History and Physical, chart, labs and discussed the procedure including the risks, benefits and alternatives for the proposed anesthesia with the patient or authorized representative who has indicated his/her understanding and acceptance.   Dental advisory given  Plan Discussed with: CRNA  Anesthesia Plan Comments:       Anesthesia Quick Evaluation

## 2017-08-02 NOTE — Op Note (Signed)
08/02/2017  1:17 PM  PATIENT:  Joseph Allen  46 y.o. male  PRE-OPERATIVE DIAGNOSIS:  High-grade right distal biceps tendon injury  POST-OPERATIVE DIAGNOSIS:  Same  PROCEDURE:  Open repair of right distal biceps tendon  SURGEON: Rayvon Char. Grandville Silos, MD  PHYSICIAN ASSISTANT: Morley Kos, OPA-C  ANESTHESIA:  regional and general  SPECIMENS:  None  DRAINS:   None  EBL:  less than 50 mL  PREOPERATIVE INDICATIONS:  Joseph Allen is a  46 y.o. male with High-grade right distal biceps tendon injury.  The risks benefits and alternatives were discussed with the patient preoperatively including but not limited to the risks of infection, bleeding, nerve injury, cardiopulmonary complications, the need for revision surgery, among others, and the patient verbalized understanding and consented to proceed.  OPERATIVE IMPLANTS: Arthrex bio button and interference screw  OPERATIVE PROCEDURE:  After receiving prophylactic antibiotics and a regional block, the patient was escorted to the operative theatre and placed in a supine position.  General anesthesia was administered.A surgical "time-out" was performed during which the planned procedure, proposed operative site, and the correct patient identity were compared to the operative consent and agreement confirmed by the circulating nurse according to current facility policy.  Following application of a tourniquet to the operative extremity, the exposed skin was prepped with Chloraprep and draped in the usual sterile fashion.  The limb was exsanguinated with an Esmarch bandage and the tourniquet inflated to approximately 143mmHg higher than systolic BP.  A transverse incision exploited and the natural skin creases about 2 fingerbreadths distal to the antecubital crease was made sharply with a scalpel.  Subcutaneous tissues were dissected with blunt and spreading dissection.  The biceps tendon sheath was identified and opened through spreading  longitudinal dissection.  There was a lot of organizing fibrinous material about the tendon, which was entirely avulsed from the radius with the exception of a small 2 mm connection that remained intact.  This was divided.  The tendon ends were mucoid and mop ended.  This was resected back to healthy tendon.  The radial tuberosity was further cleaned of debris with a curet and Roger.  #5 FiberWire whipstitch was placed into the tendon and through the bio button.  The tunnel for the bio button was made, followed by an 8 mm near cortex overreaming.  The wound was copiously irrigated and suction used to help clear all of the bone dust and fragments.  The bio button was placed and flipped on the opposite side of the far cortex, and its proper placement confirmed with the mini C arm.  Images were saved.  The tendon was then potted into its bed and secured with #5 FiberWire tied, one limb having been brought to the tendon with a free needle.  The interference screw was then placed in standard fashion, on the radial side of the tendon, pushing the tendon towards the ulnar side of the hole.  The button was placed with a suture limb through it so that it could be further tied for added fixation.  Full extension could be achieved without any movement in the tendon, which was held quite solidly.  I then flexed the elbow and passive pronation was accommodated as well.  The wound was irrigated, tourniquet released, some additional hemostasis obtained and the skin was closed with 3-0 Vicryl deep dermal buried interrupted sutures and a couple of additional 4-0 Vicryl interrupted subcuticular sutures followed by benzoin and Steri-Strips.  A bulky dressing was applied followed by  sling and he was awakened and taken to the recovery room in stable condition, breathing spontaneously.  DISPOSITION: He will be discharged home today with typical instructions, returning in 10-15 days.  RADIOGRAPHS: 2 views of the right elbow obtained  fluoroscopically, printed, and saved reveal metallic fixation button adjacent to the radial cortex, with lucencies consistent with the two-step sized tunnels.  The button appears in proper position and alignment.

## 2017-08-02 NOTE — Transfer of Care (Signed)
Immediate Anesthesia Transfer of Care Note  Patient: Joseph Allen  Procedure(s) Performed: Procedure(s): REPAIR OF RIGHT DISTAL BICEPS TENDON (Right)  Patient Location: PACU  Anesthesia Type:GA combined with regional for post-op pain  Level of Consciousness: awake and patient cooperative  Airway & Oxygen Therapy: Patient Spontanous Breathing and Patient connected to face mask oxygen  Post-op Assessment: Report given to RN and Post -op Vital signs reviewed and stable  Post vital signs: Reviewed and stable  Last Vitals:  Vitals:   08/02/17 1258 08/02/17 1259  BP: 126/82   Pulse: 74 72  Resp: 16 18  Temp:    SpO2: 99% 98%    Last Pain:  Vitals:   08/02/17 1155  PainSc: 0-No pain      Patients Stated Pain Goal: 0 (59/53/96 [id number])  Complications: No apparent anesthesia complications

## 2017-08-02 NOTE — Interval H&P Note (Signed)
History and Physical Interval Note:  08/02/2017 1:16 PM  Joseph Allen  has presented today for surgery, with the diagnosis of RIGHT DISTAL BICEPS RUPTURE S46.211A  The various methods of treatment have been discussed with the patient and family. After consideration of risks, benefits and other options for treatment, the patient has consented to  Procedure(s): REPAIR OF RIGHT DISTAL BICEPS TENDON (Right) as a surgical intervention .  The patient's history has been reviewed, patient examined, no change in status, stable for surgery.  I have reviewed the patient's chart and labs.  Questions were answered to the patient's satisfaction.     Saranne Crislip A.

## 2017-08-02 NOTE — Anesthesia Procedure Notes (Signed)
Procedure Name: LMA Insertion Date/Time: 08/02/2017 1:27 PM Performed by: Modestine Scherzinger D Pre-anesthesia Checklist: Patient identified, Emergency Drugs available, Suction available and Patient being monitored Patient Re-evaluated:Patient Re-evaluated prior to induction Oxygen Delivery Method: Circle system utilized Preoxygenation: Pre-oxygenation with 100% oxygen Induction Type: IV induction Ventilation: Mask ventilation without difficulty LMA: LMA inserted LMA Size: 4.0 Number of attempts: 1 Airway Equipment and Method: Bite block Placement Confirmation: positive ETCO2 Tube secured with: Tape Dental Injury: Teeth and Oropharynx as per pre-operative assessment

## 2017-08-02 NOTE — Discharge Instructions (Signed)
Post Anesthesia Home Care Instructions  Activity: Get plenty of rest for the remainder of the day. A responsible individual must stay with you for 24 hours following the procedure.  For the next 24 hours, DO NOT: -Drive a car -Paediatric nurse -Drink alcoholic beverages -Take any medication unless instructed by your physician -Make any legal decisions or sign important papers.  Meals: Start with liquid foods such as gelatin or soup. Progress to regular foods as tolerated. Avoid greasy, spicy, heavy foods. If nausea and/or vomiting occur, drink only clear liquids until the nausea and/or vomiting subsides. Call your physician if vomiting continues.  Special Instructions/Symptoms: Your throat may feel dry or sore from the anesthesia or the breathing tube placed in your throat during surgery. If this causes discomfort, gargle with warm salt water. The discomfort should disappear within 24 hours.  If you had a scopolamine patch placed behind your ear for the management of post- operative nausea and/or vomiting:  1. The medication in the patch is effective for 72 hours, after which it should be removed.  Wrap patch in a tissue and discard in the trash. Wash hands thoroughly with soap and water. 2. You may remove the patch earlier than 72 hours if you experience unpleasant side effects which may include dry mouth, dizziness or visual disturbances. 3. Avoid touching the patch. Wash your hands with soap and water after contact with the patch.     Regional Anesthesia Blocks  1. Numbness or the inability to move the "blocked" extremity may last from 3-48 hours after placement. The length of time depends on the medication injected and your individual response to the medication. If the numbness is not going away after 48 hours, call your surgeon.  2. The extremity that is blocked will need to be protected until the numbness is gone and the  Strength has returned. Because you cannot feel it, you  will need to take extra care to avoid injury. Because it may be weak, you may have difficulty moving it or using it. You may not know what position it is in without looking at it while the block is in effect.  3. For blocks in the legs and feet, returning to weight bearing and walking needs to be done carefully. You will need to wait until the numbness is entirely gone and the strength has returned. You should be able to move your leg and foot normally before you try and bear weight or walk. You will need someone to be with you when you first try to ensure you do not fall and possibly risk injury.  4. Bruising and tenderness at the needle site are common side effects and will resolve in a few days.  5. Persistent numbness or new problems with movement should be communicated to the surgeon or the [facility] [phone] [place] ([phone]).   Discharge Instructions   You have a light dressing on your hand and arm.  You may begin gentle motion of your fingers and hand immediately, but you should not do any heavy lifting or gripping.  Elevate your hand to reduce pain & swelling of the digits.  Ice over the operative site may be helpful to reduce pain & swelling, ice in the arm pit may do the same.  DO NOT USE HEAT. Pain medicine has been prescribed for you.  Utilize the tylenol 650 mg and the ibuprofen 600 mg OTC taken together every 6 hours. The oxycodone can be taken as  a rescue medicine additionally. Leave the dressing in place until your return appointment.  Utilize the sling with the exception of working on ROM exercises. You may drive a car when you are off of prescription pain medications and can safely control your vehicle with both hands. We will address whether therapy will be required or not when you return to the office. You may have already made your follow-up appointment when we completed your preop visit.  If not, please call our office today or the  next business day to make your return appointment for 10-15 days after surgery.   Please call [phone] during normal business hours or [phone] after hours for any problems. Including the following:  - excessive redness of the incisions - drainage for more than 4 days - fever of more than 101.5 F  *Please note that pain medications will not be refilled after hours or on weekends.

## 2017-08-03 ENCOUNTER — Encounter (HOSPITAL_BASED_OUTPATIENT_CLINIC_OR_DEPARTMENT_OTHER): Payer: Self-pay | Admitting: Orthopedic Surgery

## 2017-08-03 NOTE — Anesthesia Postprocedure Evaluation (Signed)
Anesthesia Post Note  Patient: Joseph Allen  Procedure(s) Performed: Procedure(s) (LRB): REPAIR OF RIGHT DISTAL BICEPS TENDON (Right)     Patient location during evaluation: PACU Anesthesia Type: General and Regional Level of consciousness: sedated and patient cooperative Pain management: pain level controlled Vital Signs Assessment: post-procedure vital signs reviewed and stable Respiratory status: spontaneous breathing Cardiovascular status: stable Anesthetic complications: no    Last Vitals:  Vitals:   08/02/17 1530 08/02/17 1545  BP: 118/74 128/75  Pulse: 70 69  Resp: 12 16  Temp:  36.6 C  SpO2: 96%     Last Pain:  Vitals:   08/02/17 1545  PainSc: 0-No pain                 Nolon Nations

## 2017-08-12 NOTE — Addendum Note (Signed)
Addendum  created 08/12/17 0854 by Nolon Nations, MD   Anesthesia Intra Blocks edited, Sign clinical note

## 2018-01-27 DIAGNOSIS — Z1389 Encounter for screening for other disorder: Secondary | ICD-10-CM | POA: Diagnosis not present

## 2018-01-27 DIAGNOSIS — A4901 Methicillin susceptible Staphylococcus aureus infection, unspecified site: Secondary | ICD-10-CM | POA: Diagnosis not present

## 2018-04-25 DIAGNOSIS — Z125 Encounter for screening for malignant neoplasm of prostate: Secondary | ICD-10-CM | POA: Diagnosis not present

## 2018-04-25 DIAGNOSIS — Z0000 Encounter for general adult medical examination without abnormal findings: Secondary | ICD-10-CM | POA: Diagnosis not present

## 2018-04-25 DIAGNOSIS — R82998 Other abnormal findings in urine: Secondary | ICD-10-CM | POA: Diagnosis not present

## 2018-05-02 DIAGNOSIS — R7301 Impaired fasting glucose: Secondary | ICD-10-CM | POA: Diagnosis not present

## 2018-05-02 DIAGNOSIS — Z0000 Encounter for general adult medical examination without abnormal findings: Secondary | ICD-10-CM | POA: Diagnosis not present

## 2018-05-02 DIAGNOSIS — Z125 Encounter for screening for malignant neoplasm of prostate: Secondary | ICD-10-CM | POA: Diagnosis not present

## 2018-05-02 DIAGNOSIS — D1803 Hemangioma of intra-abdominal structures: Secondary | ICD-10-CM | POA: Diagnosis not present

## 2018-05-02 DIAGNOSIS — Z1389 Encounter for screening for other disorder: Secondary | ICD-10-CM | POA: Diagnosis not present

## 2018-05-02 DIAGNOSIS — K219 Gastro-esophageal reflux disease without esophagitis: Secondary | ICD-10-CM | POA: Diagnosis not present

## 2018-05-04 DIAGNOSIS — Z1212 Encounter for screening for malignant neoplasm of rectum: Secondary | ICD-10-CM | POA: Diagnosis not present

## 2018-05-23 ENCOUNTER — Encounter (HOSPITAL_BASED_OUTPATIENT_CLINIC_OR_DEPARTMENT_OTHER): Payer: Self-pay

## 2018-05-23 ENCOUNTER — Emergency Department (HOSPITAL_BASED_OUTPATIENT_CLINIC_OR_DEPARTMENT_OTHER)
Admission: EM | Admit: 2018-05-23 | Discharge: 2018-05-23 | Disposition: A | Discharge status: Home / Self Care | Payer: No Typology Code available for payment source | Attending: Emergency Medicine | Admitting: Emergency Medicine

## 2018-05-23 ENCOUNTER — Emergency Department (HOSPITAL_BASED_OUTPATIENT_CLINIC_OR_DEPARTMENT_OTHER): Payer: No Typology Code available for payment source

## 2018-05-23 DIAGNOSIS — Z79899 Other long term (current) drug therapy: Secondary | ICD-10-CM | POA: Diagnosis not present

## 2018-05-23 DIAGNOSIS — T148XXA Other injury of unspecified body region, initial encounter: Secondary | ICD-10-CM

## 2018-05-23 DIAGNOSIS — Y9389 Activity, other specified: Secondary | ICD-10-CM | POA: Diagnosis not present

## 2018-05-23 DIAGNOSIS — S71142A Puncture wound with foreign body, left thigh, initial encounter: Secondary | ICD-10-CM | POA: Insufficient documentation

## 2018-05-23 DIAGNOSIS — M795 Residual foreign body in soft tissue: Secondary | ICD-10-CM

## 2018-05-23 DIAGNOSIS — W3400XA Accidental discharge from unspecified firearms or gun, initial encounter: Secondary | ICD-10-CM | POA: Insufficient documentation

## 2018-05-23 DIAGNOSIS — S79922A Unspecified injury of left thigh, initial encounter: Secondary | ICD-10-CM | POA: Diagnosis present

## 2018-05-23 DIAGNOSIS — Y9259 Other trade areas as the place of occurrence of the external cause: Secondary | ICD-10-CM | POA: Diagnosis not present

## 2018-05-23 DIAGNOSIS — Z23 Encounter for immunization: Secondary | ICD-10-CM | POA: Diagnosis not present

## 2018-05-23 DIAGNOSIS — Y990 Civilian activity done for income or pay: Secondary | ICD-10-CM | POA: Insufficient documentation

## 2018-05-23 MED ORDER — CEPHALEXIN 500 MG PO CAPS: 500.0000 mg | ORAL_CAPSULE | Freq: Four times a day (QID) | ORAL | 0 refills | Status: AC

## 2018-05-23 MED ORDER — TETANUS-DIPHTH-ACELL PERTUSSIS 5-2.5-18.5 LF-MCG/0.5 IM SUSP
0.5000 mL | Freq: Once | INTRAMUSCULAR | Status: AC
  Administered 2018-05-23: 0.5 mL via INTRAMUSCULAR
  Filled 2018-05-23: qty 0.5
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
## 2018-10-16 DIAGNOSIS — J209 Acute bronchitis, unspecified: Secondary | ICD-10-CM | POA: Diagnosis not present

## 2018-10-16 DIAGNOSIS — Z6828 Body mass index (BMI) 28.0-28.9, adult: Secondary | ICD-10-CM | POA: Diagnosis not present

## 2018-10-16 DIAGNOSIS — R0602 Shortness of breath: Secondary | ICD-10-CM | POA: Diagnosis not present

## 2018-12-27 DIAGNOSIS — R509 Fever, unspecified: Secondary | ICD-10-CM | POA: Diagnosis not present

## 2018-12-27 DIAGNOSIS — J111 Influenza due to unidentified influenza virus with other respiratory manifestations: Secondary | ICD-10-CM | POA: Diagnosis not present

## 2018-12-27 DIAGNOSIS — R05 Cough: Secondary | ICD-10-CM | POA: Diagnosis not present

## 2019-03-14 ENCOUNTER — Emergency Department (HOSPITAL_COMMUNITY): Payer: No Typology Code available for payment source

## 2019-03-14 ENCOUNTER — Other Ambulatory Visit: Payer: Self-pay

## 2019-03-14 ENCOUNTER — Encounter (HOSPITAL_COMMUNITY): Payer: Self-pay

## 2019-03-14 ENCOUNTER — Emergency Department (HOSPITAL_COMMUNITY)
Admission: EM | Admit: 2019-03-14 | Discharge: 2019-03-14 | Disposition: A | Discharge status: Home / Self Care | Payer: No Typology Code available for payment source | Attending: Emergency Medicine | Admitting: Emergency Medicine

## 2019-03-14 DIAGNOSIS — Y9389 Activity, other specified: Secondary | ICD-10-CM | POA: Insufficient documentation

## 2019-03-14 DIAGNOSIS — S2020XA Contusion of thorax, unspecified, initial encounter: Secondary | ICD-10-CM | POA: Insufficient documentation

## 2019-03-14 DIAGNOSIS — Z79899 Other long term (current) drug therapy: Secondary | ICD-10-CM | POA: Diagnosis not present

## 2019-03-14 DIAGNOSIS — Y990 Civilian activity done for income or pay: Secondary | ICD-10-CM | POA: Insufficient documentation

## 2019-03-14 DIAGNOSIS — S299XXA Unspecified injury of thorax, initial encounter: Secondary | ICD-10-CM | POA: Diagnosis present

## 2019-03-14 DIAGNOSIS — Y929 Unspecified place or not applicable: Secondary | ICD-10-CM | POA: Insufficient documentation

## 2019-03-14 DIAGNOSIS — T148XXA Other injury of unspecified body region, initial encounter: Secondary | ICD-10-CM

## 2019-03-14 DIAGNOSIS — S20212A Contusion of left front wall of thorax, initial encounter: Secondary | ICD-10-CM | POA: Diagnosis not present
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
## 2021-08-24 ENCOUNTER — Encounter: Payer: Self-pay | Admitting: Gastroenterology

## 2021-08-28 ENCOUNTER — Ambulatory Visit (AMBULATORY_SURGERY_CENTER): Payer: 59 | Admitting: *Deleted

## 2021-08-28 ENCOUNTER — Other Ambulatory Visit: Payer: Self-pay

## 2021-08-28 ENCOUNTER — Encounter: Payer: Self-pay | Admitting: Gastroenterology

## 2021-08-28 VITALS — Ht 69.0 in | Wt 192.0 lb

## 2021-08-28 DIAGNOSIS — Z1211 Encounter for screening for malignant neoplasm of colon: Secondary | ICD-10-CM

## 2021-08-28 NOTE — Progress Notes (Signed)
No egg or soy allergy known to patient  No issues known to pt with past sedation with any surgeries or procedures Patient denies ever being told they had issues or difficulty with intubation  No FH of Malignant Hyperthermia Pt is not on diet pills Pt is not on  home 02  Pt is not on blood thinners  Pt denies issues with constipation  No A fib or A flutter  Pt is fully vaccinated  for Covid   Due to the COVID-19 pandemic we are asking patients to follow certain guidelines in PV and the [facility]   Pt aware of COVID protocols and [facility] guidelines   Pt verified name, DOB, address and insurance during PV today.  Pt mailed instruction packet of Emmi video, copy of consent form to read and not return, and instructions.  .PV completed over the phone.  Pt encouraged to call with questions or issues.  My Chart instructions to pt as well

## 2021-09-11 ENCOUNTER — Encounter: Payer: 59 | Admitting: Gastroenterology

## 2021-10-27 ENCOUNTER — Encounter: Payer: 59 | Admitting: Gastroenterology

## 2021-12-16 ENCOUNTER — Encounter: Payer: Self-pay | Admitting: Gastroenterology

## 2022-01-14 ENCOUNTER — Other Ambulatory Visit: Payer: Self-pay | Admitting: Orthopedic Surgery

## 2022-01-14 DIAGNOSIS — M25511 Pain in right shoulder: Secondary | ICD-10-CM

## 2022-01-19 ENCOUNTER — Ambulatory Visit
Admission: RE | Admit: 2022-01-19 | Discharge: 2022-01-19 | Disposition: A | Discharge status: Home / Self Care | Payer: 59 | Source: Ambulatory Visit | Attending: Orthopedic Surgery | Admitting: Orthopedic Surgery

## 2022-01-19 ENCOUNTER — Other Ambulatory Visit: Payer: Self-pay

## 2022-01-19 DIAGNOSIS — M25511 Pain in right shoulder: Secondary | ICD-10-CM

## 2022-01-19 MED ORDER — IOPAMIDOL (ISOVUE-M 200) INJECTION 41%
1.0000 mL | Freq: Once | INTRAMUSCULAR | Status: AC
  Administered 2022-01-19: 1 mL via INTRA_ARTICULAR

## 2022-01-19 MED ORDER — METHYLPREDNISOLONE ACETATE 40 MG/ML INJ SUSP (RADIOLOG
80.0000 mg | Freq: Once | INTRAMUSCULAR | Status: AC
  Administered 2022-01-19: 80 mg via INTRA_ARTICULAR

## 2022-01-22 ENCOUNTER — Other Ambulatory Visit: Payer: Self-pay

## 2022-01-22 ENCOUNTER — Ambulatory Visit (AMBULATORY_SURGERY_CENTER): Payer: 59 | Admitting: *Deleted

## 2022-01-22 VITALS — Ht 69.0 in | Wt 192.0 lb

## 2022-01-22 DIAGNOSIS — Z1211 Encounter for screening for malignant neoplasm of colon: Secondary | ICD-10-CM

## 2022-01-22 NOTE — Progress Notes (Signed)
No egg or soy allergy known to patient  ?No issues known to pt with past sedation with any surgeries or procedures ?Patient denies ever being told they had issues or difficulty with intubation  ?No FH of Malignant Hyperthermia ?Pt is not on diet pills ?Pt is not on  home 02  ?Pt is not on blood thinners  ?Pt denies issues with constipation  ?No A fib or A flutter ? ?Pt is  vaccinated  for Covid  ? ? ?Due to the COVID-19 pandemic we are asking patients to follow certain guidelines in PV and the [place]   ?Pt aware of COVID protocols and [facility] guidelines  ? ?PV completed over the phone. Pt verified name, DOB, address and insurance during PV today.  ?Pt mailed instruction packet with copy of consent form to read and not return, and instructions.  ?Pt encouraged to call with questions or issues.  ?If pt has My chart, procedure instructions sent via My Chart   ? ?Sample sheet of Over the counter items to purchase for prep sent with packet. ?

## 2022-01-27 ENCOUNTER — Encounter: Payer: Self-pay | Admitting: Gastroenterology

## 2022-01-31 ENCOUNTER — Encounter: Payer: Self-pay | Admitting: Certified Registered Nurse Anesthetist

## 2022-02-05 ENCOUNTER — Encounter: Payer: Self-pay | Admitting: Gastroenterology

## 2022-02-05 ENCOUNTER — Ambulatory Visit (AMBULATORY_SURGERY_CENTER): Payer: 59 | Admitting: Gastroenterology

## 2022-02-05 VITALS — BP 99/69 | HR 62 | Temp 96.8°F | Resp 11 | Ht 69.0 in | Wt 192.0 lb

## 2022-02-05 DIAGNOSIS — D120 Benign neoplasm of cecum: Secondary | ICD-10-CM | POA: Diagnosis not present

## 2022-02-05 DIAGNOSIS — Z1211 Encounter for screening for malignant neoplasm of colon: Secondary | ICD-10-CM | POA: Diagnosis present

## 2022-02-05 DIAGNOSIS — D124 Benign neoplasm of descending colon: Secondary | ICD-10-CM

## 2022-02-05 DIAGNOSIS — K635 Polyp of colon: Secondary | ICD-10-CM | POA: Diagnosis not present

## 2022-02-05 MED ORDER — SODIUM CHLORIDE 0.9 % IV SOLN: 500.0000 mL | INTRAVENOUS | Status: DC

## 2022-02-05 NOTE — Patient Instructions (Signed)
Thank you for coming in to see [place] today! ?Resume previous diet and medications today, return to normal daily activities tomorrow. ?Biopsy results will be available in 1-2 weeks with recommendations for next colonoscopy. ?See page 3 of  your procedure report for further recommendations on colon health. ? ? ? ?YOU HAD AN ENDOSCOPIC PROCEDURE TODAY AT [facility]:   Refer to the procedure report that was given to you for any specific questions about what was found during the examination.  If the procedure report does not answer your questions, please call your gastroenterologist to clarify.  If you requested that your care partner not be given the details of your procedure findings, then the procedure report has been included in a sealed envelope for you to review at your convenience later. ? ?YOU SHOULD EXPECT: Some feelings of bloating in the abdomen. Passage of more gas than usual.  Walking can help get rid of the air that was put into your GI tract during the procedure and reduce the bloating. If you had a lower endoscopy (such as a colonoscopy or flexible sigmoidoscopy) you may notice spotting of blood in your stool or on the toilet paper. If you underwent a bowel prep for your procedure, you may not have a normal bowel movement for a few days. ? ?Please Note:  You might notice some irritation and congestion in your nose or some drainage.  This is from the oxygen used during your procedure.  There is no need for concern and it should clear up in a day or so. ? ?SYMPTOMS TO REPORT IMMEDIATELY: ? ?Following lower endoscopy (colonoscopy or flexible sigmoidoscopy): ? Excessive amounts of blood in the stool ? Significant tenderness or worsening of abdominal pains ? Swelling of the abdomen that is new, acute ? Fever of 100?F or higher ? ? ? ?For urgent or emergent issues, a gastroenterologist can be reached at any hour by calling [phone]. ?Do not use MyChart messaging for urgent concerns.   ? ? ?DIET:  We do recommend a small meal at first, but then you may proceed to your regular diet.  Drink plenty of fluids but you should avoid alcoholic beverages for 24 hours. ? ?ACTIVITY:  You should plan to take it easy for the rest of today and you should NOT DRIVE or use heavy machinery until tomorrow (because of the sedation medicines used during the test).   ? ?FOLLOW UP: ?Our staff will call the number listed on your records 48-72 hours following your procedure to check on you and address any questions or concerns that you may have regarding the information given to you following your procedure. If we do not reach you, we will leave a message.  We will attempt to reach you two times.  During this call, we will ask if you have developed any symptoms of COVID 19. If you develop any symptoms (ie: fever, flu-like symptoms, shortness of breath, cough etc.) before then, please call [phone].  If you test positive for Covid 19 in the 2 weeks post procedure, please call and report this information to [place].   ? ?If any biopsies were taken you will be contacted by phone or by letter within the next 1-3 weeks.  Please call us at [phone] if you have not heard about the biopsies in 3 weeks.  ? ? ?SIGNATURES/CONFIDENTIALITY: ?You and/or your care partner have signed paperwork which will be entered into your electronic medical record.  These signatures attest to the  fact that that the information above on your After Visit Summary has been reviewed and is understood.  Full responsibility of the confidentiality of this discharge information lies with you and/or your care-partner.  ?

## 2022-02-05 NOTE — Op Note (Signed)
[name] ?Patient Name: Joseph Allen ?Procedure Date: 02/05/2022 9:53 AM ?MRN: [id number] ?Endoscopist: Thornton Park MD, MD ?Age: 51 ?Referring MD:  ?Date of Birth: Jan 01, 1971 ?Gender: Male ?Account #: [id number] ?Procedure:                Colonoscopy ?Indications:              Screening for colorectal malignant neoplasm, This  ?                          is the patient's first colonoscopy ?                          No known family history of colon cancer or polyps ?Medicines:                Monitored Anesthesia Care ?Procedure:                Pre-Anesthesia Assessment: ?                          - Prior to the procedure, a History and Physical  ?                          was performed, and patient medications and  ?                          allergies were reviewed. The patient's tolerance of  ?                          previous anesthesia was also reviewed. The risks  ?                          and benefits of the procedure and the sedation  ?                          options and risks were discussed with the patient.  ?                          All questions were answered, and informed consent  ?                          was obtained. Prior Anticoagulants: The patient has  ?                          taken no previous anticoagulant or antiplatelet  ?                          agents. ASA Grade Assessment: I - A normal, healthy  ?                          patient. After reviewing the risks and benefits,  ?                          the patient was deemed in satisfactory condition to  ?  undergo the procedure. ?                          After obtaining informed consent, the colonoscope  ?                          was passed under direct vision. Throughout the  ?                          procedure, the patient's blood pressure, pulse, and  ?                          oxygen saturations were monitored continuously. The  ?                          CF [id number] #[id number] was introduced  through the anus  ?                          and advanced to the 3 cm into the ileum. A second  ?                          forward view of the right colon was performed. The  ?                          colonoscopy was performed without difficulty. The  ?                          patient tolerated the procedure well. The quality  ?                          of the bowel preparation was excellent. The  ?                          terminal ileum, ileocecal valve, appendiceal  ?                          orifice, and rectum were photographed. ?Scope In: 10:07:08 AM ?Scope Out: 10:25:07 AM ?Scope Withdrawal Time: 0 hours 15 minutes 25 seconds  ?Total Procedure Duration: 0 hours 17 minutes 59 seconds  ?Findings:                 The perianal and digital rectal examinations were  ?                          normal. ?                          Non-bleeding internal hemorrhoids were found. ?                          Multiple small and large-mouthed diverticula were  ?                          found in the sigmoid colon. ?  A 4 mm polyp was found in the descending colon. The  ?                          polyp was sessile. The polyp was removed with a  ?                          cold snare. Resection and retrieval were complete.  ?                          Estimated blood loss was minimal. ?                          A 3 mm polyp was found in the cecum. The polyp was  ?                          sessile. The polyp was removed with a cold snare.  ?                          Resection and retrieval were complete. Estimated  ?                          blood loss was minimal. ?                          The exam was otherwise without abnormality on  ?                          direct and retroflexion views. ?Complications:            No immediate complications. ?Estimated Blood Loss:     Estimated blood loss was minimal. ?Impression:               - Non-bleeding internal hemorrhoids. ?                          -  Diverticulosis in the sigmoid colon. ?                          - One 4 mm polyp in the descending colon, removed  ?                          with a cold snare. Resected and retrieved. ?                          - One 3 mm polyp in the cecum, removed with a cold  ?                          snare. Resected and retrieved. ?                          - The examination was otherwise normal on direct  ?                          and retroflexion views. ?Recommendation:           -  Patient has a contact number available for  ?                          emergencies. The signs and symptoms of potential  ?                          delayed complications were discussed with the  ?                          patient. Return to normal activities tomorrow.  ?                          Written discharge instructions were provided to the  ?                          patient. ?                          - High fiber diet. ?                          - Continue present medications. ?                          - Await pathology results. ?                          - Repeat colonoscopy date to be determined after  ?                          pending pathology results are reviewed for  ?                          surveillance. ?                          - Follow a high fiber diet. Drink at least 64  ?                          ounces of water daily. Add a daily stool bulking  ?                          agent such as psyllium (an exampled would be  ?                          Metamucil). ?                          - Emerging evidence supports eating a diet of  ?                          fruits, vegetables, grains, calcium, and yogurt  ?                          while reducing red meat and alcohol may reduce the  ?  risk of colon cancer. ?                          - Thank you for allowing me to be involved in your  ?                          colon cancer prevention. ?Thornton Park MD, MD ?02/05/2022 10:31:56 AM ?This report has  been signed electronically. ?

## 2022-02-05 NOTE — Progress Notes (Signed)
Called to room to assist during endoscopic procedure.  Patient ID and intended procedure confirmed with present staff. Received instructions for my participation in the procedure from the performing physician.  

## 2022-02-05 NOTE — Progress Notes (Signed)
Report given to PACU, vss 
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
## 2022-02-09 ENCOUNTER — Telehealth: Payer: Self-pay

## 2022-02-09 NOTE — Telephone Encounter (Signed)
?  Follow up Call- ? ? ?  02/05/2022  ?  9:47 AM  ?Call back number  ?Post procedure Call Back phone  # [phone]  ?Permission to leave phone message Yes  ?  ? ?Patient questions: ? ?Do you have a fever, pain , or abdominal swelling? No. ?Pain Score  0 * ? ?Have you tolerated food without any problems? Yes.   ? ?Have you been able to return to your normal activities? Yes.   ? ?Do you have any questions about your discharge instructions: ?Diet   No. ?Medications  No. ?Follow up visit  No. ? ?Do you have questions or concerns about your Care? No. ? ?Actions: ?* If pain score is 4 or above: ?No action needed, pain <4. ? ? ?

## 2022-02-11 ENCOUNTER — Encounter: Payer: Self-pay | Admitting: Gastroenterology

## 2023-09-15 ENCOUNTER — Other Ambulatory Visit: Payer: Self-pay | Admitting: Family Medicine

## 2023-09-15 ENCOUNTER — Ambulatory Visit: Payer: Self-pay

## 2023-09-15 DIAGNOSIS — M549 Dorsalgia, unspecified: Secondary | ICD-10-CM

## 2023-09-15 DIAGNOSIS — M542 Cervicalgia: Secondary | ICD-10-CM

## 2023-09-15 DIAGNOSIS — M25511 Pain in right shoulder: Secondary | ICD-10-CM

## 2024-01-15 IMAGING — XA DG FLUORO GUIDE NDL PLC/BX
1 series · 1 of 1 positions shown · IV contrast (isovue)
Comparison: none

CLINICAL DATA: Right shoulder pain

EXAM:
RIGHT SHOULDER INJECTION UNDER FLUOROSCOPY
TECHNIQUE: An appropriate skin entrance site was determined. The site was
marked, prepped with Betadine, draped in the usual sterile fashion,
and infiltrated locally with 1% lidocaine. A 22 gauge spinal needle
was advanced to the superomedial margin of the humeral head under
intermittent fluoroscopy. 1 mL of 1% lidocaine injected easily.
Injection of a small amount of Isovue-M 200 confirmed
intra-articular placement. There was no vascular opacification.
Subsequently, a mixture of 80 mg of Depo-Medrol and 4 mL of 0.25%
bupivacaine was injected. There was no immediate complication.
FLUOROSCOPY:
Radiation Exposure Index (as provided by the fluoroscopic device):
0.50 mGy Kerma

[Series 1: ortho adipose · 1 of 1 slices shown]
[im 1/1]
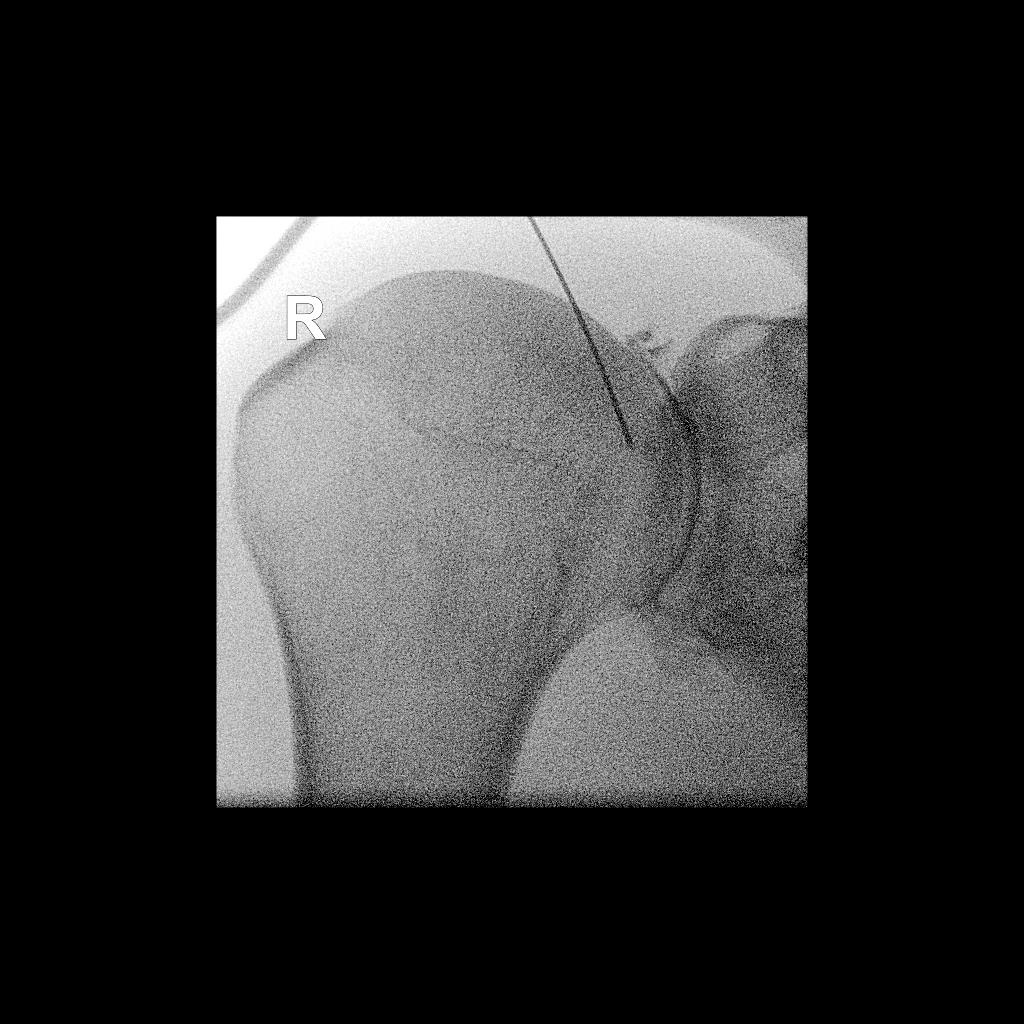

[1 of 1 positions shown; findings below may reference images not displayed]

IMPRESSION: Technically successful fluoroscopically guided right shoulder
steroid injection.
